# Patient Record
Sex: Male | Born: 1985 | Race: Asian | Hispanic: No | Marital: Married | Smoking: Current every day smoker
Health system: Southern US, Community
[De-identification: ages and names within clinical notes are randomized; demographics above are authoritative.]

## PROBLEM LIST (undated history)

## (undated) DIAGNOSIS — I10 Essential (primary) hypertension: Secondary | ICD-10-CM

## (undated) DIAGNOSIS — F419 Anxiety disorder, unspecified: Secondary | ICD-10-CM

---

## 2020-12-19 ENCOUNTER — Encounter (HOSPITAL_COMMUNITY): Payer: Self-pay

## 2020-12-19 ENCOUNTER — Other Ambulatory Visit: Payer: Self-pay

## 2020-12-19 ENCOUNTER — Emergency Department (HOSPITAL_COMMUNITY)
Admission: EM | Admit: 2020-12-19 | Discharge: 2020-12-19 | Disposition: A | Discharge status: Home / Self Care | Payer: Self-pay | Attending: Emergency Medicine | Admitting: Emergency Medicine

## 2020-12-19 ENCOUNTER — Emergency Department (HOSPITAL_COMMUNITY): Payer: Self-pay

## 2020-12-19 DIAGNOSIS — M546 Pain in thoracic spine: Secondary | ICD-10-CM | POA: Insufficient documentation

## 2020-12-19 DIAGNOSIS — I10 Essential (primary) hypertension: Secondary | ICD-10-CM | POA: Insufficient documentation

## 2020-12-19 DIAGNOSIS — F17210 Nicotine dependence, cigarettes, uncomplicated: Secondary | ICD-10-CM | POA: Insufficient documentation

## 2020-12-19 DIAGNOSIS — R0789 Other chest pain: Secondary | ICD-10-CM | POA: Insufficient documentation

## 2020-12-19 HISTORY — DX: Anxiety disorder, unspecified: F41.9

## 2020-12-19 HISTORY — DX: Essential (primary) hypertension: I10

## 2020-12-19 LAB — CBC
HCT: 51 % (ref 39.0–52.0)
Hemoglobin: 16.9 g/dL (ref 13.0–17.0)
MCH: 30.6 pg (ref 26.0–34.0)
MCHC: 33.1 g/dL (ref 30.0–36.0)
MCV: 92.4 fL (ref 80.0–100.0)
Platelets: 314 10*3/uL (ref 150–400)
RBC: 5.52 MIL/uL (ref 4.22–5.81)
RDW: 12.9 % (ref 11.5–15.5)
WBC: 8.6 10*3/uL (ref 4.0–10.5)
nRBC: 0 % (ref 0.0–0.2)

## 2020-12-19 LAB — BASIC METABOLIC PANEL
Anion gap: 7 (ref 5–15)
BUN: 15 mg/dL (ref 6–20)
CO2: 25 mmol/L (ref 22–32)
Calcium: 9.4 mg/dL (ref 8.9–10.3)
Chloride: 106 mmol/L (ref 98–111)
Creatinine, Ser: 1.01 mg/dL (ref 0.61–1.24)
GFR, Estimated: >60 mL/min (ref >60)
Glucose, Bld: 110 mg/dL — ABNORMAL HIGH (ref 70–99)
Potassium: 4.1 mmol/L (ref 3.5–5.1)
Sodium: 138 mmol/L (ref 135–145)

## 2020-12-19 LAB — D-DIMER, QUANTITATIVE: D-Dimer, Quant: 0.27 ug/mL-FEU (ref 0.00–0.50)

## 2020-12-19 LAB — TROPONIN I (HIGH SENSITIVITY)
Troponin I (High Sensitivity): 3 ng/L (ref <18)
Troponin I (High Sensitivity): 2 ng/L (ref <18)

## 2020-12-19 MED ORDER — ALUM & MAG HYDROXIDE-SIMETH 200-200-20 MG/5ML PO SUSP
30.0000 mL | Freq: Once | ORAL | Status: AC
  Administered 2020-12-19: 30 mL via ORAL
  Filled 2020-12-19: qty 30

## 2020-12-19 MED ORDER — LIDOCAINE VISCOUS HCL 2 % MT SOLN
15.0000 mL | Freq: Once | OROMUCOSAL | Status: AC
  Administered 2020-12-19: 15 mL via ORAL
  Filled 2020-12-19: qty 15

## 2020-12-19 MED ORDER — PANTOPRAZOLE SODIUM 20 MG PO TBEC: 20.0000 mg | DELAYED_RELEASE_TABLET | Freq: Every day | ORAL | 0 refills | Status: AC

## 2020-12-19 MED ORDER — PANTOPRAZOLE SODIUM 40 MG IV SOLR
40.0000 mg | Freq: Once | INTRAVENOUS | Status: AC
  Administered 2020-12-19: 40 mg via INTRAVENOUS
  Filled 2020-12-19: qty 40

## 2020-12-19 MED ORDER — SODIUM CHLORIDE 0.9 % IV BOLUS
1000.0000 mL | Freq: Once | INTRAVENOUS | Status: AC
  Administered 2020-12-19: 1000 mL via INTRAVENOUS

## 2020-12-19 NOTE — Discharge Instructions (Addendum)
At this time there does not appear to be the presence of an emergent medical condition, however there is always the potential for conditions to change. Please read and follow the below instructions.  Please return to the Emergency Department immediately for any new or worsening symptoms. Please be sure to follow up with your Primary Care Provider within one week regarding your visit today; please call their office to schedule an appointment even if you are feeling better for a follow-up visit. Please take the medication Protonix as prescribed to help with acid reflux related symptoms. Please stop smoking.  Go to the nearest Emergency Department immediately if: You have fever or chills Your chest pain is worse. You have a cough that gets worse, or you cough up blood. You have very bad (severe) pain in your belly (abdomen). You pass out (faint). You have either of these for no clear reason: Sudden chest discomfort. Sudden discomfort in your arms, back, neck, or jaw. You have shortness of breath at any time. You suddenly start to sweat, or your skin gets clammy. You feel sick to your stomach (nauseous). You throw up (vomit). You suddenly feel lightheaded or dizzy. You feel very weak or tired. Your heart starts to beat fast, or it feels like it is skipping beats. You have any new/concerning or worsening of symptoms   Please read the additional information packets attached to your discharge summary.  Do not take your medicine if  develop an itchy rash, swelling in your mouth or lips, or difficulty breathing; call 911 and seek immediate emergency medical attention if this occurs.  You may review your lab tests and imaging results in their entirety on your MyChart account.  Please discuss all results of fully with your primary care provider and other specialist at your follow-up visit.  Note: Portions of this text may have been transcribed using voice recognition software. Every effort was  made to ensure accuracy; however, inadvertent computerized transcription errors may still be present.

## 2020-12-19 NOTE — ED Provider Notes (Addendum)
[facility]-EMERGENCY DEPT Provider Note   CSN: [id number] Arrival date & time: 12/19/20  1046     History Chief Complaint  Patient presents with  . Back Pain  . Chest Pain    Roger Gomez is a 35 y.o. male history of hypertension anxiety otherwise healthy.  Patient presents today for evaluation of chest pain onset around 10 AM this morning he was trying to pick up his dog when he had sudden onset left-sided chest pain sharp worsened with movement and deep breaths no alleviating factors no radiation of pain denies similar pain in the past.  Additionally patient reports that around 3 days ago he was doing the dishes for family member when he "blacked out".  He reports that he did not fall during this episode reports that he began leaning over but was able to catch himself with his hands on the counter and did not hit the ground  Additionally patient reports upper back pain that has been present for greater than 3 months there has been aching constant worsened with movement no alleviating factors he does not feel like this pain is worse or changed today.  Denies fever/chills, recent illness, headache, vision change, cough, hemoptysis, abdominal pain, nausea/vomiting, extremity swelling/color change, numbness/tingling, weakness, saddle paresthesias, bowel/bladder incontinence, urinary retention, family history of heart disease or any additional concerns.  HPI     Past Medical History:  Diagnosis Date  . Anxiety   . Hypertension     There are no problems to display for this patient.   History reviewed. No pertinent surgical history.     Family History  Problem Relation Age of Onset  . Stroke Father     Social History   Tobacco Use  . Smoking status: Current Every Day Smoker    Packs/day: 1.00    Types: Cigarettes  . Smokeless tobacco: Never Used  Vaping Use  . Vaping Use: Never used  Substance Use Topics  . Alcohol use: Not Currently  . Drug  use: Never    Home Medications Prior to Admission medications   Medication Sig Start Date End Date Taking? Authorizing Provider  pantoprazole (PROTONIX) 20 MG tablet Take 1 tablet (20 mg total) by mouth daily. 12/19/20  Yes Harlene Salts A, PA-C    Allergies    Patient has no known allergies.  Review of Systems   Review of Systems Ten systems are reviewed and are negative for acute change except as noted in the HPI  Physical Exam Updated Vital Signs BP (!) 106/51   Pulse 64   Temp 98.5 F (36.9 C) (Oral)   Resp 18   Ht 5\' 11"  (1.803 m)   Wt 81.2 kg   SpO2 100%   BMI 24.97 kg/m   Physical Exam Constitutional:      General: He is not in acute distress.    Appearance: Normal appearance. He is well-developed. He is not ill-appearing or diaphoretic.  HENT:     Head: Normocephalic and atraumatic.  Eyes:     General: Vision grossly intact. Gaze aligned appropriately.     Pupils: Pupils are equal, round, and reactive to light.  Neck:     Trachea: Trachea and phonation normal.  Cardiovascular:     Rate and Rhythm: Normal rate and regular rhythm.     Pulses:          Radial pulses are 2+ on the right side and 2+ on the left side.       Dorsalis  pedis pulses are 2+ on the right side and 2+ on the left side.  Pulmonary:     Effort: Pulmonary effort is normal. No respiratory distress.     Breath sounds: Normal breath sounds.  Chest:     Chest wall: Tenderness present. No deformity.  Abdominal:     General: There is no distension.     Palpations: Abdomen is soft. There is no pulsatile mass.     Tenderness: There is no abdominal tenderness. There is no guarding or rebound.  Musculoskeletal:        General: Normal range of motion.     Cervical back: Normal range of motion.     Right lower leg: No tenderness. No edema.     Left lower leg: No tenderness. No edema.  Skin:    General: Skin is warm and dry.  Neurological:     Mental Status: He is alert.     GCS: GCS eye  subscore is 4. GCS verbal subscore is 5. GCS motor subscore is 6.     Comments: Speech is clear and goal oriented, follows commands Major Cranial nerves without deficit, no facial droop Moves extremities without ataxia, coordination intact  Psychiatric:        Behavior: Behavior normal.     ED Results / Procedures / Treatments   Labs (all labs ordered are listed, but only abnormal results are displayed) Labs Reviewed  BASIC METABOLIC PANEL - Abnormal; Notable for the following components:      Result Value   Glucose, Bld 110 (*)    All other components within normal limits  CBC  D-DIMER, QUANTITATIVE  TROPONIN I (HIGH SENSITIVITY)  TROPONIN I (HIGH SENSITIVITY)    EKG EKG Interpretation  Date/Time:  Friday December 19 2020 10:59:53 EDT Ventricular Rate:  76 PR Interval:    QRS Duration: 91 QT Interval:  415 QTC Calculation: 467 R Axis:   80 Text Interpretation: Sinus rhythm PR depression 12 Lead; Mason-Likar No previous ECGs available Confirmed by Alvira Monday ([id number]) on 12/19/2020 4:36:15 PM   Radiology DG Chest 2 View  Result Date: 12/19/2020 CLINICAL DATA:  Chest pain back pain for several months. Smoker. Heartburn. EXAM: CHEST - 2 VIEW COMPARISON:  None. FINDINGS: Midline trachea.  Normal heart size and mediastinal contours. Sharp costophrenic angles. No pneumothorax. Clear lungs. EKG lead artifacts project over the upper lobes bilaterally. IMPRESSION: Normal chest. Electronically Signed   By: Jeronimo Greaves M.D.   On: 12/19/2020 12:26    Procedures Procedures   Medications Ordered in ED Medications  pantoprazole (PROTONIX) injection 40 mg (40 mg Intravenous Given 12/19/20 1440)  sodium chloride 0.9 % bolus 1,000 mL (0 mLs Intravenous Stopped 12/19/20 1713)  alum & mag hydroxide-simeth (MAALOX/MYLANTA) 200-200-20 MG/5ML suspension 30 mL (30 mLs Oral Given 12/19/20 1442)    And  lidocaine (XYLOCAINE) 2 % viscous mouth solution 15 mL (15 mLs Oral Given 12/19/20 1442)     ED Course  I have reviewed the triage vital signs and the nursing notes.  Pertinent labs & imaging results that were available during my care of the patient were reviewed by me and considered in my medical decision making (see chart for details).    MDM Rules/Calculators/A&P                         Additional history obtained from: 1. Nursing notes from this visit. 2. Review of electronic medical records.  No pertinent recent medical visits,  patient does have some primary care visits in 2019 for back pain and a diagnosis of lumbar pars defect. ================= I ordered, reviewed and interpreted labs which include: High-sensitivity troponin within normal limits x2. CBC within normal limits. D-dimer within normal limits. BMP shows hyperglycemia 110 otherwise within normal limits.   CXR:  IMPRESSION:  Normal chest.     EKG: Sinus rhythm PR depression 12 Lead; Mason-Likar No previous ECGs available Confirmed by Alvira Monday ([id number]) on 12/19/2020 4:36:15 PM --------------------------- Patient reassessed resting comfortably no acute distress reports that he is feeling well, pain-free after receiving Protonix and GI cocktail.  He has eaten a sandwich here in the ER walking to the bathroom and back without assistance or difficulty, vital signs are stable.  Pain is reproducible with palpation of the chest wall, pain began when he was lifting his dog today high suspicion for musculoskeletal chest wall pain as etiology of his symptoms today.  Possibility that GI reflux could play a part as it did improve with Protonix.  Discussed rice therapy with the patient we will also prescribe him Protonix in case of acid reflux component.  Heart score is less than 1.  Low suspicion for ACS, PE, dissection, pericarditis, tamponade, pneumonia or other emergent cardiopulmonary pathologies at this time.  Additionally as to patient's back pain there is no evidence of injury today, some mild muscular  tenderness on exam.  Suspect muscular back pain.  No indication for imaging at this time.  Doubt cauda equina, AAA, kidney stone disease, spinal epidural abscess or other emergent pathologies at this time.  Finally as to patient's questionable "blackout" he did not fall to the ground possible orthostasis and has not recurred. Additionally this episode preceded his chest pain by days  Doubt emergent cardiopulmonary pathology at this time I encourage patient to call his PCP today to schedule a follow-up appointment.  Discussed smoking cessation with patient and was they were offerred resources to help stop.  Total time was 5 min CPT code 05397.   At this time there does not appear to be any evidence of an acute emergency medical condition and the patient appears stable for discharge with appropriate outpatient follow up. Diagnosis was discussed with patient who verbalizes understanding of care plan and is agreeable to discharge. I have discussed return precautions with patient who verbalizes understanding. Patient encouraged to follow-up with their PCP. All questions answered.  Patient's case discussed with Dr. Dalene Seltzer who agrees with plan to discharge with follow-up.   Note: Portions of this report may have been transcribed using voice recognition software. Every effort was made to ensure accuracy; however, inadvertent computerized transcription errors may still be present. Final Clinical Impression(s) / ED Diagnoses Final diagnoses:  Chest wall pain    Rx / DC Orders ED Discharge Orders         Ordered    pantoprazole (PROTONIX) 20 MG tablet  Daily        12/19/20 1758           Bill Salinas, PA-C 12/19/20 1806    Elizabeth Palau 12/19/20 Merrily Brittle    Alvira Monday, MD 12/20/20 [phone]

## 2020-12-19 NOTE — ED Notes (Signed)
Discharge paperwork reviewed with pt, including prescription.  Pt verbalized understanding, ambulatory to ED exit.

## 2020-12-19 NOTE — ED Triage Notes (Addendum)
patient reports that he began to have chest pain that hurt when he took a deep breath or talked. Patient states he has a history of heartburn. Patient also c/o upper back pain that he states has hurt for months , but today the pain was worse.  Patient added that he "blacked out 3 days ago for approx 2 seconds."

## 2021-10-24 IMAGING — CR DG CHEST 2V
2 series · 2 of 2 positions shown · non-contrast
Comparison: None.

CLINICAL DATA: Chest pain back pain for several months. Smoker.
Heartburn.

EXAM:
CHEST - 2 VIEW

[w chest pa]
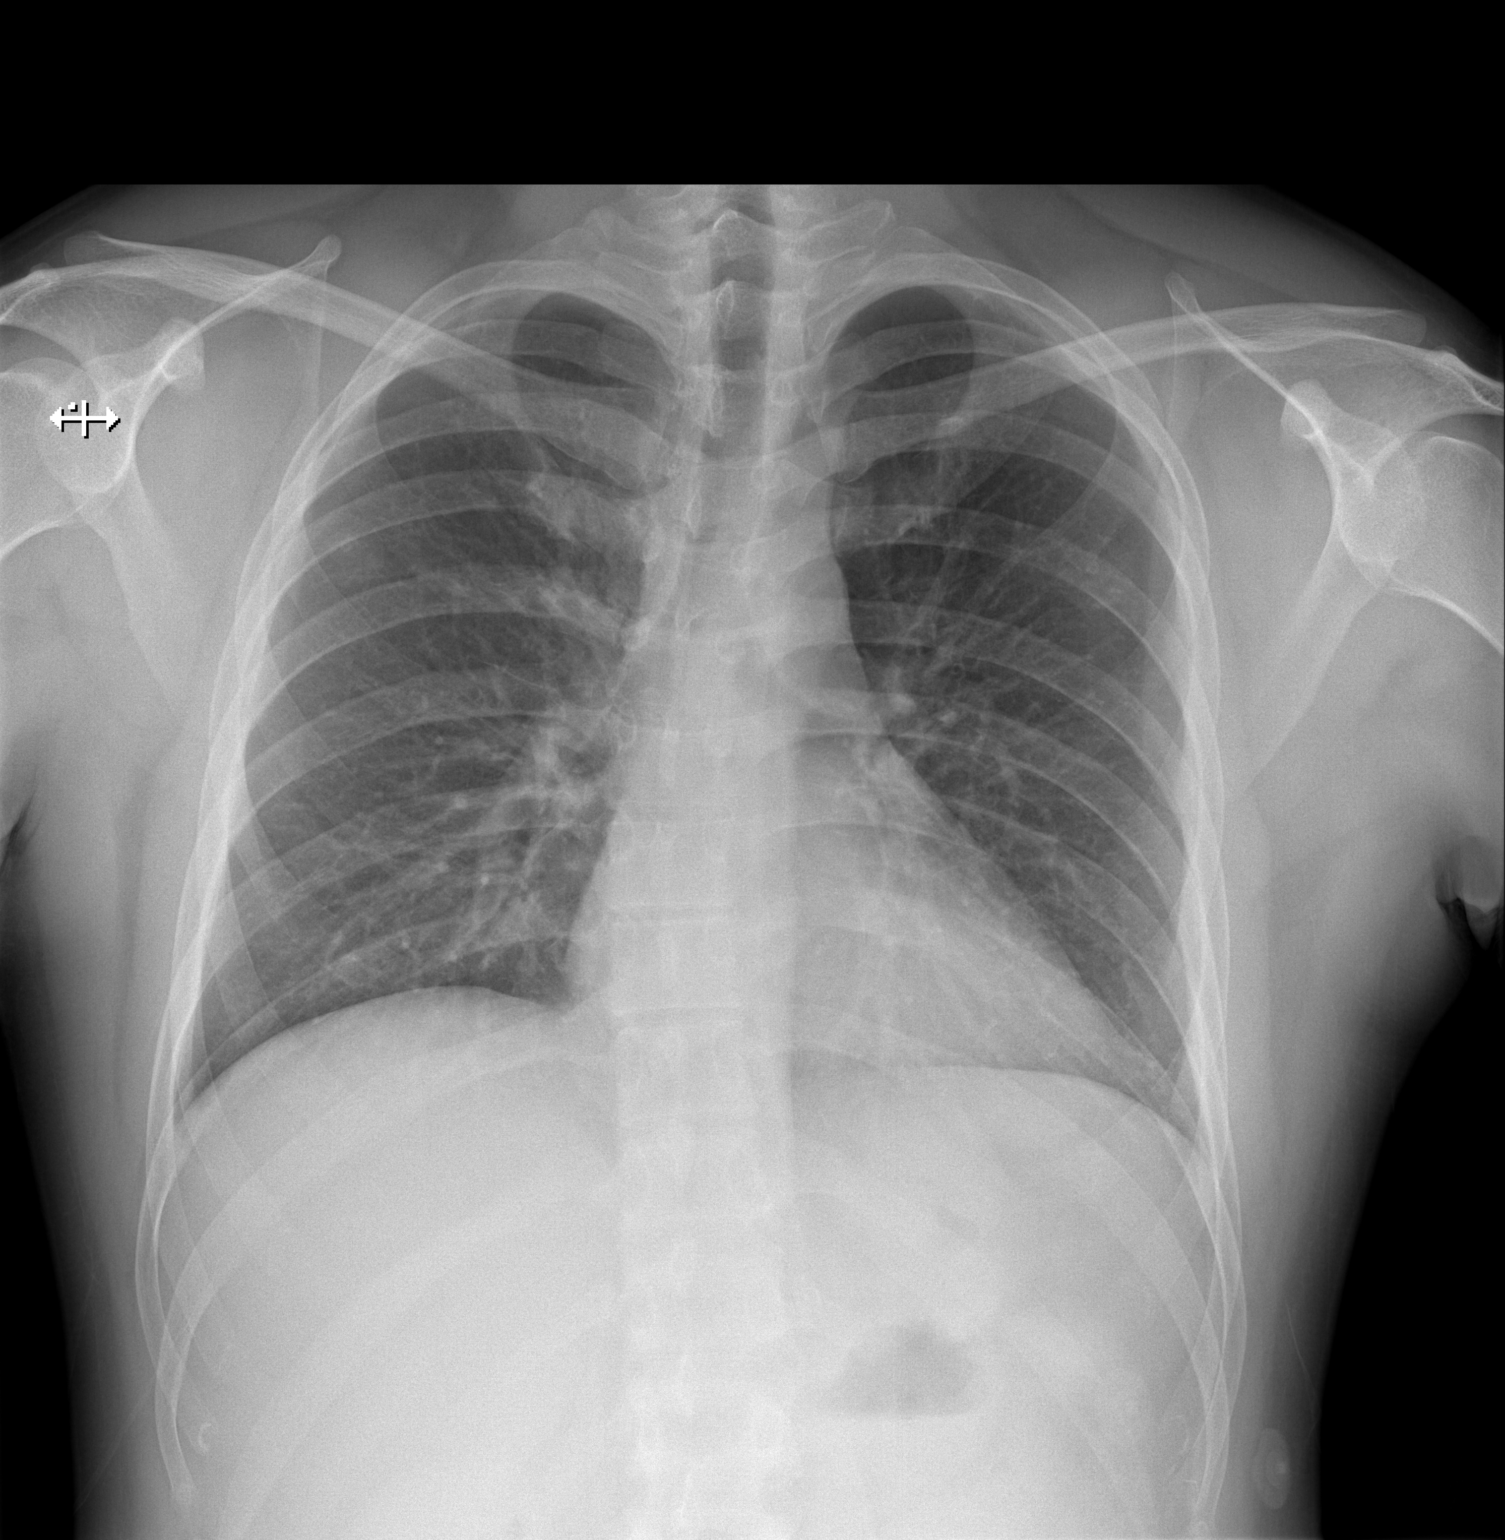

[w chest lat]
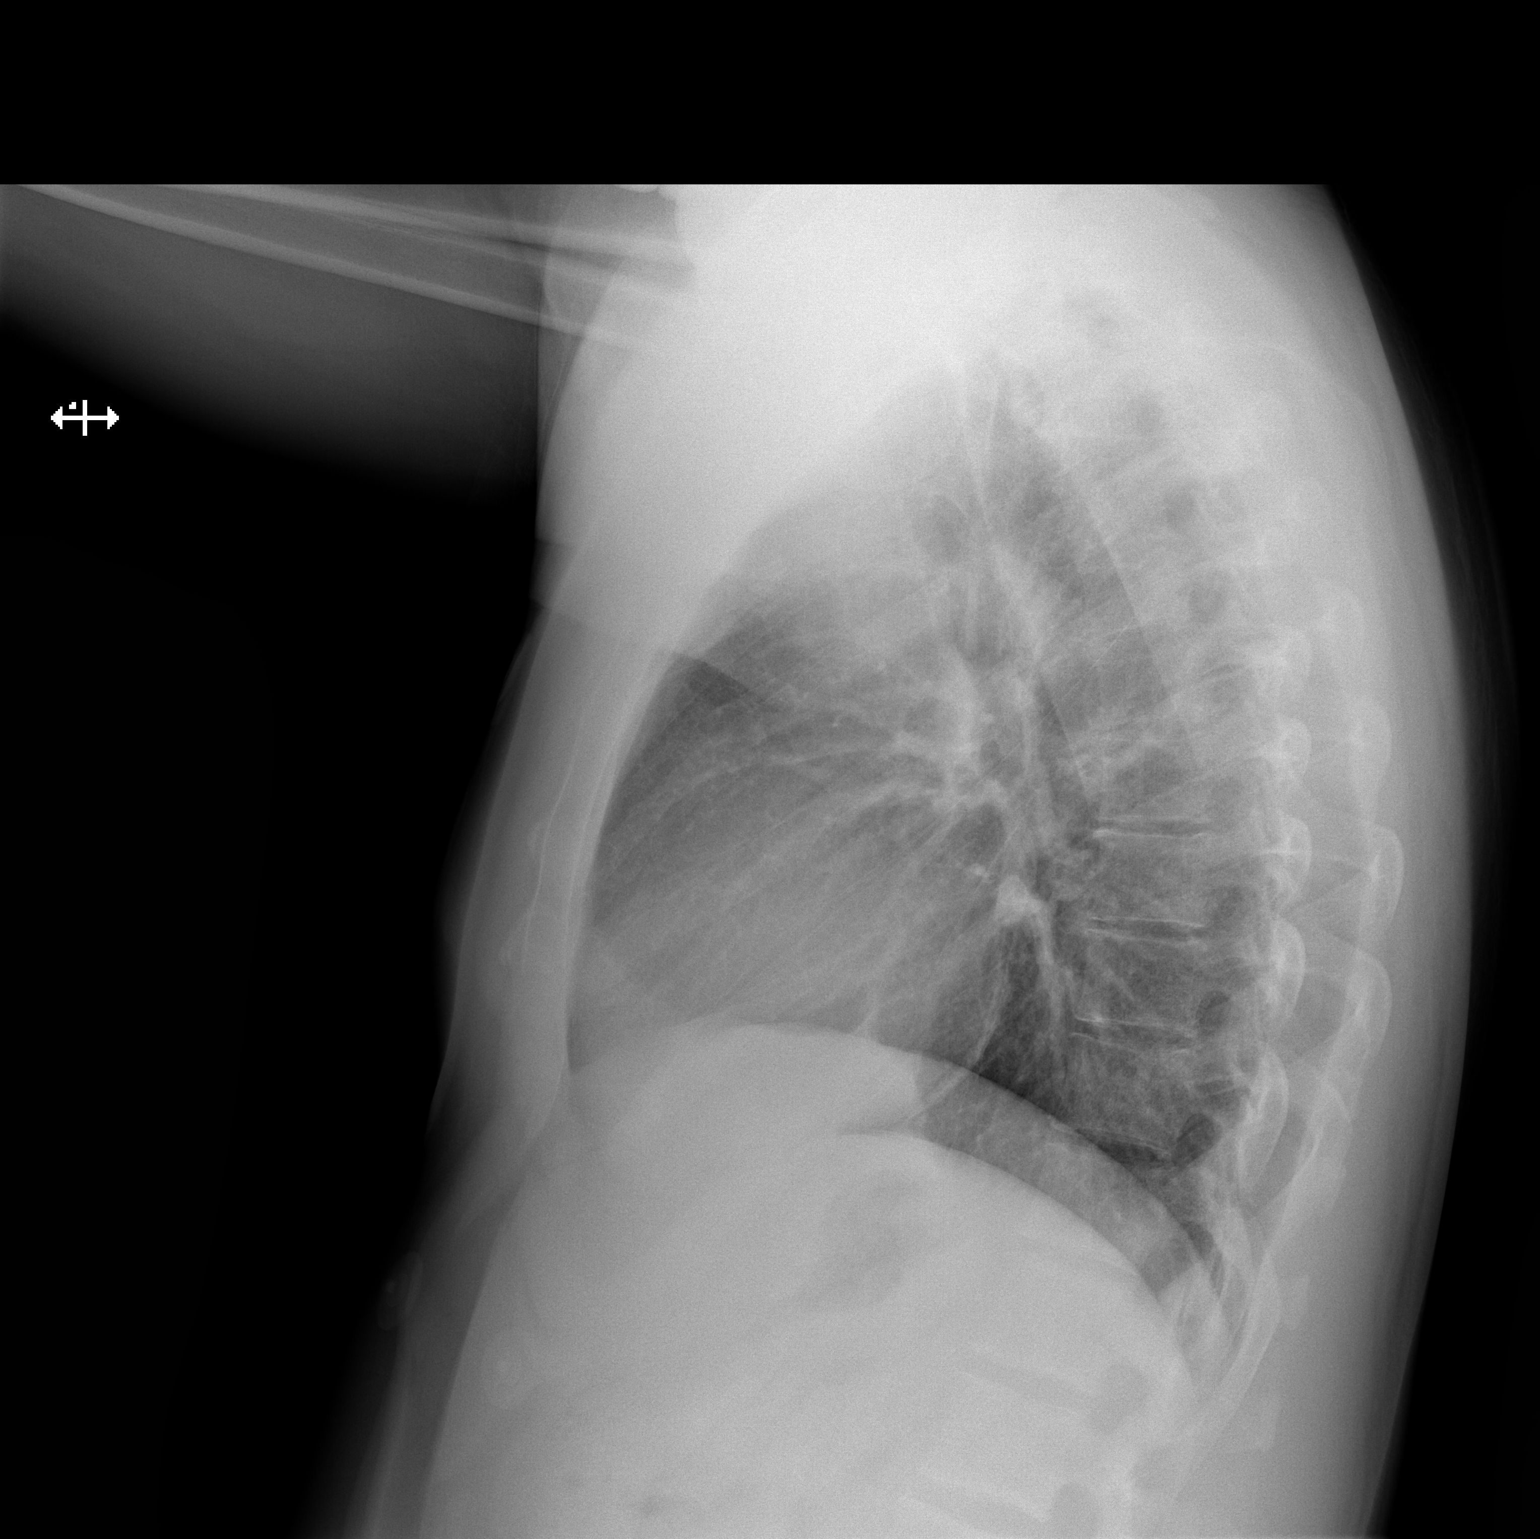

[2 of 2 positions shown; findings below may reference images not displayed]

FINDINGS: Midline trachea.  Normal heart size and mediastinal contours.

Sharp costophrenic angles. No pneumothorax. Clear lungs. EKG lead
artifacts project over the upper lobes bilaterally.
IMPRESSION: Normal chest.
